# Patient Record
Sex: Female | Born: 1995 | Race: Black or African American | Hispanic: No | Marital: Single | Smoking: Never smoker
Health system: Southern US, Community
[De-identification: ages and names within clinical notes are randomized; demographics above are authoritative.]

## PROBLEM LIST (undated history)

## (undated) DIAGNOSIS — R569 Unspecified convulsions: Secondary | ICD-10-CM

## (undated) DIAGNOSIS — I10 Essential (primary) hypertension: Secondary | ICD-10-CM

## (undated) HISTORY — PX: BUTTOCK LIFT: SHX1278

## (undated) HISTORY — DX: Unspecified convulsions: R56.9

## (undated) HISTORY — DX: Essential (primary) hypertension: I10

---

## 2021-04-09 ENCOUNTER — Emergency Department (HOSPITAL_BASED_OUTPATIENT_CLINIC_OR_DEPARTMENT_OTHER): Payer: 59

## 2021-04-09 ENCOUNTER — Emergency Department (HOSPITAL_BASED_OUTPATIENT_CLINIC_OR_DEPARTMENT_OTHER)
Admission: EM | Admit: 2021-04-09 | Discharge: 2021-04-09 | Disposition: A | Discharge status: Home / Self Care | Payer: 59 | Attending: Emergency Medicine | Admitting: Emergency Medicine

## 2021-04-09 ENCOUNTER — Encounter: Payer: Self-pay | Admitting: Neurology

## 2021-04-09 ENCOUNTER — Other Ambulatory Visit: Payer: Self-pay

## 2021-04-09 ENCOUNTER — Encounter (HOSPITAL_BASED_OUTPATIENT_CLINIC_OR_DEPARTMENT_OTHER): Payer: Self-pay | Admitting: Emergency Medicine

## 2021-04-09 DIAGNOSIS — R110 Nausea: Secondary | ICD-10-CM | POA: Insufficient documentation

## 2021-04-09 DIAGNOSIS — R569 Unspecified convulsions: Secondary | ICD-10-CM | POA: Diagnosis not present

## 2021-04-09 DIAGNOSIS — R251 Tremor, unspecified: Secondary | ICD-10-CM | POA: Diagnosis not present

## 2021-04-09 LAB — CBC WITH DIFFERENTIAL/PLATELET
Abs Immature Granulocytes: 0.03 10*3/uL (ref 0.00–0.07)
Basophils Absolute: 0 10*3/uL (ref 0.0–0.1)
Basophils Relative: 1 %
Eosinophils Absolute: 0.1 10*3/uL (ref 0.0–0.5)
Eosinophils Relative: 1 %
HCT: 33.4 % — ABNORMAL LOW (ref 36.0–46.0)
Hemoglobin: 10.9 g/dL — ABNORMAL LOW (ref 12.0–15.0)
Immature Granulocytes: 1 %
Lymphocytes Relative: 43 %
Lymphs Abs: 2.1 10*3/uL (ref 0.7–4.0)
MCH: 26.1 pg (ref 26.0–34.0)
MCHC: 32.6 g/dL (ref 30.0–36.0)
MCV: 79.9 fL — ABNORMAL LOW (ref 80.0–100.0)
Monocytes Absolute: 0.3 10*3/uL (ref 0.1–1.0)
Monocytes Relative: 6 %
Neutro Abs: 2.4 10*3/uL (ref 1.7–7.7)
Neutrophils Relative %: 48 %
Platelet Morphology: NORMAL
Platelets: 253 10*3/uL (ref 150–400)
RBC: 4.18 MIL/uL (ref 3.87–5.11)
RDW: 13.2 % (ref 11.5–15.5)
WBC: 4.9 10*3/uL (ref 4.0–10.5)
nRBC: 0 % (ref 0.0–0.2)

## 2021-04-09 LAB — BASIC METABOLIC PANEL
Anion gap: 8 (ref 5–15)
BUN: 10 mg/dL (ref 6–20)
CO2: 23 mmol/L (ref 22–32)
Calcium: 8.5 mg/dL — ABNORMAL LOW (ref 8.9–10.3)
Chloride: 105 mmol/L (ref 98–111)
Creatinine, Ser: 0.59 mg/dL (ref 0.44–1.00)
GFR, Estimated: >60 mL/min (ref >60)
Glucose, Bld: 112 mg/dL — ABNORMAL HIGH (ref 70–99)
Potassium: 4.2 mmol/L (ref 3.5–5.1)
Sodium: 136 mmol/L (ref 135–145)

## 2021-04-09 LAB — RAPID URINE DRUG SCREEN, HOSP PERFORMED
Amphetamines: NOT DETECTED
Barbiturates: NOT DETECTED
Benzodiazepines: NOT DETECTED
Cocaine: NOT DETECTED
Opiates: NOT DETECTED
Tetrahydrocannabinol: POSITIVE — AB

## 2021-04-09 LAB — PREGNANCY, URINE: Preg Test, Ur: NEGATIVE

## 2021-04-09 MED ORDER — LEVETIRACETAM 500 MG/5ML IV SOLN: 2000.0000 mg | Freq: Once | INTRAVENOUS | Status: DC

## 2021-04-09 MED ORDER — LEVETIRACETAM IN NACL 1500 MG/100ML IV SOLN: 1500.0000 mg | Freq: Once | INTRAVENOUS | Status: DC

## 2021-04-09 MED ORDER — LEVETIRACETAM 500 MG PO TABS: 500.0000 mg | ORAL_TABLET | Freq: Two times a day (BID) | ORAL | 0 refills | Status: DC

## 2021-04-09 MED ORDER — LEVETIRACETAM IN NACL 1000 MG/100ML IV SOLN
1000.0000 mg | INTRAVENOUS | Status: AC
  Administered 2021-04-09 (×2): 1000 mg via INTRAVENOUS
  Filled 2021-04-09: qty 100

## 2021-04-09 NOTE — ED Notes (Signed)
Patient transported to CT 

## 2021-04-09 NOTE — ED Provider Notes (Signed)
[facility] EMERGENCY DEPT Provider Note   CSN: [id number] Arrival date & time: 04/09/21  0443     History No chief complaint on file.   Mary Nunez is a 25 y.o. female.  The history is provided by the patient.  Seizures Seizure activity on arrival: no   Seizure type:  Grand mal (and 2 previous that involved unresponsive staring) Preceding symptoms: aura and nausea   Preceding symptoms: no dizziness   Preceding symptoms comment:  On the staring episodes Initial focality:  None Episode characteristics: generalized shaking and unresponsiveness   Postictal symptoms: somnolence   Return to baseline: yes   Severity:  Moderate Duration: seconds. Number of seizures this episode:  2 Progression:  Resolved Context: not alcohol withdrawal, not intracranial shunt, medical compliance, not pregnant and not previous head injury   Recent head injury:  No recent head injuries PTA treatment:  None History of seizures: no   2 staring episodes with preceded by aura and nausea.  First in September but wasn't seen and then one tonight and then a second with generalized shaking.       History reviewed. No pertinent past medical history.  There are no problems to display for this patient.   History reviewed. No pertinent surgical history.   OB History   No obstetric history on file.     History reviewed. No pertinent family history.     Home Medications Prior to Admission medications   Medication Sig Start Date End Date Taking? Authorizing Provider  levETIRAcetam (KEPPRA) 500 MG tablet Take 1 tablet (500 mg total) by mouth 2 (two) times daily. 04/09/21  Yes Lorali Khamis, MD    Allergies    Patient has no known allergies.  Review of Systems   Review of Systems  Constitutional:  Negative for fever.  HENT:  Negative for congestion.   Eyes:  Negative for redness.  Respiratory:  Negative for wheezing and stridor.   Cardiovascular:  Negative for chest pain,  palpitations and leg swelling.  Gastrointestinal:  Negative for vomiting.  Genitourinary:  Negative for difficulty urinating.  Musculoskeletal:  Negative for neck stiffness.  Skin:  Negative for color change.  Neurological:  Positive for seizures.  Psychiatric/Behavioral:  Negative for agitation.   All other systems reviewed and are negative.  Physical Exam Updated Vital Signs BP (!) 142/93 (BP Location: Right Arm)   Pulse 85   Temp 98.1 F (36.7 C) (Oral)   Resp 18   Ht 5\' 5"  (1.651 m)   Wt 93.9 kg   LMP 03/28/2021 Comment: pt's abdomen shielded for CT of head.  SpO2 100%   BMI 34.45 kg/m   Physical Exam Vitals and nursing note reviewed.  Constitutional:      General: She is not in acute distress.    Appearance: Normal appearance.  HENT:     Head: Normocephalic and atraumatic.     Nose: Nose normal.  Eyes:     Conjunctiva/sclera: Conjunctivae normal.     Pupils: Pupils are equal, round, and reactive to light.  Cardiovascular:     Rate and Rhythm: Normal rate and regular rhythm.     Pulses: Normal pulses.     Heart sounds: Normal heart sounds.  Pulmonary:     Effort: Pulmonary effort is normal.     Breath sounds: Normal breath sounds.  Abdominal:     General: Abdomen is flat. Bowel sounds are normal.     Palpations: Abdomen is soft.     Tenderness:  There is no abdominal tenderness. There is no guarding.  Musculoskeletal:        General: Normal range of motion.     Cervical back: Normal range of motion and neck supple.  Skin:    General: Skin is warm and dry.     Capillary Refill: Capillary refill takes less than 2 seconds.  Neurological:     General: No focal deficit present.     Mental Status: She is alert and oriented to person, place, and time.     Deep Tendon Reflexes: Reflexes normal.  Psychiatric:        Mood and Affect: Mood normal.        Behavior: Behavior normal.    ED Results / Procedures / Treatments   Labs (all labs ordered are listed, but  only abnormal results are displayed) Labs Reviewed  PREGNANCY, URINE  CBC WITH DIFFERENTIAL/PLATELET  BASIC METABOLIC PANEL  RAPID URINE DRUG SCREEN, HOSP PERFORMED    EKG None  Radiology CT Head Wo Contrast  Result Date: 04/09/2021 CLINICAL DATA:  Possible seizure EXAM: CT HEAD WITHOUT CONTRAST TECHNIQUE: Contiguous axial images were obtained from the base of the skull through the vertex without intravenous contrast. COMPARISON:  None. FINDINGS: Brain: No evidence of acute infarction, hemorrhage, hydrocephalus, extra-axial collection or mass lesion/mass effect. Vascular: No hyperdense vessel or unexpected calcification. Skull: Normal. Negative for fracture or focal lesion. Sinuses/Orbits: No acute finding. Other: None. IMPRESSION: Negative head CT. Electronically Signed   By: Malachy Moan M.D.   On: 04/09/2021 05:17    Procedures Procedures   Medications Ordered in ED Medications  levETIRAcetam (KEPPRA) IVPB 1000 mg/100 mL premix (has no administration in time range)    ED Course  I have reviewed the triage vital signs and the nursing notes.  Pertinent labs & imaging results that were available during my care of the patient were reviewed by me and considered in my medical decision making (see chart for details).   530 case d/w Dr. Derry Lory, load woth 2 grams of keppra then start 500 mg BID, follow up with neuro as an outpatient  Patient informed no driving for six months or until cleared by neurology.  Patient informed verbally with nurse present and in writing on discharge paperwork.    Mary Nunez was evaluated in Emergency Department on 04/09/2021 for the symptoms described in the history of present illness. She was evaluated in the context of the global COVID-19 pandemic, which necessitated consideration that the patient might be at risk for infection with the SARS-CoV-2 virus that causes COVID-19. Institutional protocols and algorithms that pertain to the evaluation  of patients at risk for COVID-19 are in a state of rapid change based on information released by regulatory bodies including the CDC and federal and state organizations. These policies and algorithms were followed during the patient's care in the ED.  Final Clinical Impression(s) / ED Diagnoses Final diagnoses:  None   Return for intractable cough, coughing up blood, fevers > 100.4 unrelieved by medication, shortness of breath, intractable vomiting, chest pain, shortness of breath, weakness, numbness, changes in speech, facial asymmetry, abdominal pain, passing out, Inability to tolerate liquids or food, cough, altered mental status or any concerns. No signs of systemic illness or infection. The patient is nontoxic-appearing on exam and vital signs are within normal limits.  I have reviewed the triage vital signs and the nursing notes. Pertinent labs & imaging results that were available during my care of the patient were reviewed  by me and considered in my medical decision making (see chart for details). After history, exam, and medical workup I feel the patient has been appropriately medically screened and is safe for discharge home. Pertinent diagnoses were discussed with the patient. Patient was given return precautions.      Rx / DC Orders ED Discharge Orders          Ordered    levETIRAcetam (KEPPRA) 500 MG tablet  2 times daily        04/09/21 0523             Manoah Deckard, MD 04/09/21 0559

## 2021-04-09 NOTE — Discharge Instructions (Addendum)
No driving for six months or until cleared by neurology.

## 2021-04-09 NOTE — ED Notes (Signed)
Paged Neurology (Dr.S. Derry Lory) to Dr. Nicanor Alcon

## 2021-04-09 NOTE — ED Triage Notes (Signed)
Pt boyfriend reports that around 2130 last night the pt had a period where she c/o nausea and then was staring, could not get her words out for about 2 minutes. This morning around 0330 when he felt her shaking all over in the bed for about 5 minutes and she would not respond to him. Pt said she had a similar episode in September and she was told by EMS that she likely had a seizure, she is not on any medications for seizures. She c/o headache now, alert and oriented.

## 2021-04-16 ENCOUNTER — Encounter: Payer: Self-pay | Admitting: Neurology

## 2021-04-16 ENCOUNTER — Other Ambulatory Visit: Payer: Self-pay

## 2021-04-16 ENCOUNTER — Ambulatory Visit (INDEPENDENT_AMBULATORY_CARE_PROVIDER_SITE_OTHER): Payer: 59 | Admitting: Neurology

## 2021-04-16 VITALS — BP 142/91 | HR 86 | Ht 65.0 in | Wt 210.8 lb

## 2021-04-16 DIAGNOSIS — G40009 Localization-related (focal) (partial) idiopathic epilepsy and epileptic syndromes with seizures of localized onset, not intractable, without status epilepticus: Secondary | ICD-10-CM | POA: Diagnosis not present

## 2021-04-16 DIAGNOSIS — Z8249 Family history of ischemic heart disease and other diseases of the circulatory system: Secondary | ICD-10-CM

## 2021-04-16 MED ORDER — LEVETIRACETAM 500 MG PO TABS: 500.0000 mg | ORAL_TABLET | Freq: Two times a day (BID) | ORAL | 11 refills | Status: DC

## 2021-04-16 NOTE — Progress Notes (Signed)
NEUROLOGY CONSULTATION NOTE  Mary Nunez MRN: [id number] DOB: 11-24-1995  Referring provider: Dr. Cy Nunez (ER) Primary care provider: Charna Archer, FNP  Reason for consult:  seizure  Dear Dr Mary Nunez:  Thank you for your kind referral of Mary Nunez for consultation of the above symptoms. Although her history is well known to you, please allow me to reiterate it for the purpose of our medical record. The patient was accompanied to the clinic by her mother Mary Nunez who also provides collateral information. Records and images were personally reviewed where available.  HISTORY OF PRESENT ILLNESS: This is a pleasant 25 year old right-handed woman with no significant past medical history presenting for evaluation of seizures. She reports that she has had recurrent stereotyped episodes since 2017 where she would have a feeling of deja vu, out of place, overwhelming nausea for 5 minutes. She would be able to talk and comprehend during them. More recently, she also started having a gasoline smell with the symptoms. They were occurring every couple of weeks. She had her first episode of unresponsiveness in 01/2021, she was staring off and trying to speak but unable to. She was amnestic of the episodes, her sister called EMS at that time, she was not started on medication. She was symptom-free for 2 months until 04/08/21, she was asleep when she woke up from sleep and felt the same aura with nausea, then was staring off and unable to get her words out for 2 minutes. A few hours later, at 3:30am on 11/11, her boyfriend felt her shaking in bed. This lasted 5 minutes, she bit her tongue and had urinary incontinence. She woke up at home and denies any focal weakness. She was brought to the ER where bloodwork showed a Hct of 33.4, UDS positive for THC. I personally reviewed head CT without contrast, no acute changes. No prior sleep deprivation or alcohol. She endorses more stress recently. She was  discharged home on Levetiracetam 500mg  BID. She denies any auras in the past week. She has been feeling more irritable and fatigued on Levetiracetam. Prior to this, mood is "pretty normal." She has a history of depression and anxiety and does not take any medication for these.   She denies any focal numbness/tingling/weakness, no myoclonic jerks. She has been having headaches over the right hemisphere since stating the LEV. No dizziness, diplopia, dysarthria/dysphagia, neck/back pain, bowel/bladder dysfunction. Memory "could be better." She lives with her cousin. 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 Her mother has a history of cerebral aneurysm.   RF: maternal great grand uncle, maternal grand uncle started when younger, mother also has passing out; dermoid cyst in ovary, had c-section ,   Epilepsy Risk Factors:  Her maternal great grand uncle and maternal grand uncle had seizures since youth and passed away from complications of seizure. Her mother also has episodes of loss of consciousness (not on ASMs). Her mother had a dermoid cyst in her ovary during pregnancy and had a C-section. She had a normal birth and early development.  There is no history of febrile convulsions, CNS infections such as meningitis/encephalitis, significant traumatic brain injury, neurosurgical procedures   PAST MEDICAL HISTORY: Past Medical History:  Diagnosis Date   Hypertension    Seizure (HCC)     PAST SURGICAL HISTORY: Past Surgical History:  Procedure Laterality Date   BUTTOCK LIFT      MEDICATIONS: Current Outpatient Medications on File Prior to Visit  Medication Sig Dispense Refill   levETIRAcetam (KEPPRA) 500 MG tablet Take 1 tablet (500 mg total) by mouth 2 (two) times daily. 60 tablet 0   No current facility-administered medications on file prior to visit.    ALLERGIES: No Known Allergies  FAMILY HISTORY: No family history on file.  SOCIAL  HISTORY: Social History   Socioeconomic History   Marital status: Single    Spouse name: Not on file   Number of children: Not on file   Years of education: Not on file   Highest education level: Not on file  Occupational History   Not on file  Tobacco Use   Smoking status: Never   Smokeless tobacco: Never  Vaping Use   Vaping Use: Never used  Substance and Sexual Activity   Alcohol use: Never   Drug use: Never   Sexual activity: Not on file  Other Topics Concern   Not on file  Social History Narrative   Right handed    Social Determinants of Health   Financial Resource Strain: Not on file  Food Insecurity: Not on file  Transportation Needs: Not on file  Physical Activity: Not on file  Stress: Not on file  Social Connections: Not on file  Intimate Partner Violence: Not on file     PHYSICAL EXAM: Vitals:   04/16/21 0849  BP: (!) 142/91  Pulse: 86  SpO2: 97%   General: No acute distress Head:  Normocephalic/atraumatic Skin/Extremities: No rash, no edema Neurological Exam: Mental status: alert and oriented to person, place, and time, no dysarthria or aphasia, Fund of knowledge is appropriate.  Recent and remote memory are intact, 3/3 delayed recall.  Attention and concentration are normal, 5/5 WORLD backwards.  Cranial nerves: CN I: not tested CN II: pupils equal, round and reactive to light, visual fields intact CN III, IV, VI:  full range of motion, no nystagmus, no ptosis CN V: facial sensation intact CN VII: upper and lower face symmetric CN VIII: hearing intact to conversation Bulk & Tone: normal, no fasciculations. Motor: 5/5 throughout with no pronator drift. Sensation: intact to light touch, cold, pin, vibration sense.  No extinction to double simultaneous stimulation.  Romberg test negative Deep Tendon Reflexes: +1 throughout, no ankle clonus Plantar responses: downgoing bilaterally Cerebellar: no incoordination on finger to nose testing Gait:  narrow-based and steady, able to tandem walk adequately. 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 She has had auras of deja vu, epigastric sensation, olfactory hallucinations since 2017, then had a focal impaired awareness seizure in 01/2021 and most recently 04/08/21 followed by a convulsion 04/09/21. MRI brain with and without contrast and 1-hour EEG will be ordered as part of seizure workup. Her mother has a history of cerebral aneurysm, MRA head without contrast will be ordered as well. She is having some side effects on Levetiracetam 500mg  BID but we have agreed to give it a little more time, she will update me in a couple of weeks. If side effects are intolerable, we will plan to switch to Lamotrigine. Issues in women with epilepsy were discussed, she is not on birth control, start daily folic acid. [place] driving laws were discussed with the patient, and she knows to stop driving after a seizure, until 6 months seizure-free. She was advised to keep a seizure calendar, follow-up after tests. They know to call for any changes.  Thank you for allowing me to participate in the care of this patient. Please do not hesitate to call for any questions or concerns.   Patrcia Dolly, M.D.  CC: Dr. Nicanor Nunez, Mary Archer, FNP

## 2021-04-16 NOTE — Patient Instructions (Addendum)
Good to meet you.  Schedule MRI brain with and without contrast  2. Schedule 1-hour EEG  3. Continue Keppra 500mg  twice a day. Update me in a couple of weeks on how you are feeling.  4. Start a daily folic acid 1mg  tablet (you can get this over the counter)  5. Follow-up after tests, call for any changes   Seizure Precautions: 1. If medication has been prescribed for you to prevent seizures, take it exactly as directed.  Do not stop taking the medicine without talking to your doctor first, even if you have not had a seizure in a long time.   2. Avoid activities in which a seizure would cause danger to yourself or to others.  Don't operate dangerous machinery, swim alone, or climb in high or dangerous places, such as on ladders, roofs, or girders.  Do not drive unless your doctor says you may.  3. If you have any warning that you may have a seizure, lay down in a safe place where you can't hurt yourself.    4.  No driving for 6 months from last seizure, as per [facility].   Please refer to the following link on the [employer]ile[employer] of [employer]'s website for more information: http://www.epilepsyfoundation.org/answerplace/Social/driving/drivingu.cfm   5.  Maintain good sleep hygiene. Avoid alcohol.  6.  Notify your neurology if you are planning pregnancy or if you become pregnant.  7.  Contact your doctor if you have any problems that may be related to the medicine you are taking.  8.  Call 911 and bring the patient back to the ED if:        A.  The seizure lasts longer than 5 minutes.       B.  The patient doesn't awaken shortly after the seizure  C.  The patient has new problems such as difficulty seeing, speaking or moving  D.  The patient was injured during the seizure  E.  The patient has a temperature over 102 F (39C)  F.  The patient vomited and now is having trouble breathing

## 2021-04-25 ENCOUNTER — Encounter: Payer: Self-pay | Admitting: Neurology

## 2021-04-29 ENCOUNTER — Ambulatory Visit: Payer: 59 | Admitting: Neurology

## 2021-04-29 ENCOUNTER — Other Ambulatory Visit: Payer: Self-pay

## 2021-04-29 DIAGNOSIS — G40009 Localization-related (focal) (partial) idiopathic epilepsy and epileptic syndromes with seizures of localized onset, not intractable, without status epilepticus: Secondary | ICD-10-CM | POA: Diagnosis not present

## 2021-05-08 MED ORDER — LEVETIRACETAM 500 MG PO TABS: 500.0000 mg | ORAL_TABLET | Freq: Two times a day (BID) | ORAL | 3 refills | Status: DC

## 2021-05-09 ENCOUNTER — Other Ambulatory Visit: Payer: Self-pay

## 2021-05-09 ENCOUNTER — Ambulatory Visit
Admission: RE | Admit: 2021-05-09 | Discharge: 2021-05-09 | Disposition: A | Discharge status: Home / Self Care | Payer: 59 | Source: Ambulatory Visit | Attending: Neurology | Admitting: Neurology

## 2021-05-09 DIAGNOSIS — Z8249 Family history of ischemic heart disease and other diseases of the circulatory system: Secondary | ICD-10-CM

## 2021-05-09 DIAGNOSIS — G40009 Localization-related (focal) (partial) idiopathic epilepsy and epileptic syndromes with seizures of localized onset, not intractable, without status epilepticus: Secondary | ICD-10-CM

## 2021-05-09 IMAGING — MR MR HEAD WO/W CM
15 series · 48 of 48 positions shown · IV contrast (multihance)
Comparison: None.

CLINICAL DATA: Localization-related idiopathic epilepsy and
epileptic syndromes with seizures of localized onset, not
intractable, without status epilepticus.

EXAM:
MRI HEAD WITHOUT AND WITH CONTRAST
TECHNIQUE: Multiplanar, multiecho pulse sequences of the brain and surrounding
structures were obtained without and with intravenous contrast.
CONTRAST:  20mL MULTIHANCE GADOBENATE DIMEGLUMINE 529 MG/ML IV SOLN

[Series 6: T1 · sagittal · 4.0mm · 0.75mm/px · 2 of 29 slices shown (1 of 3)]
[im 1/29]
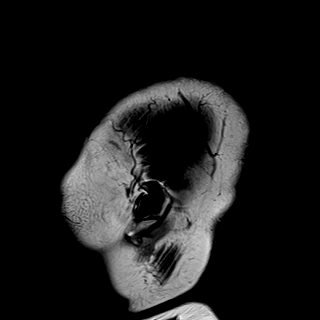
[im 29/29]
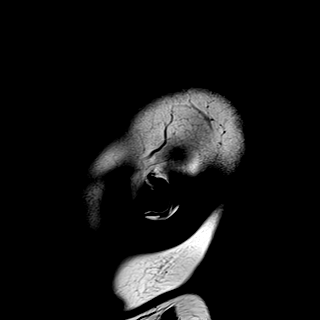

[Series 10: DWI · axial · 3.0mm · 0.94mm/px · z∈[-70,+70]mm · 8 of 160 slices shown (1 of 3)]
[im 1/160]
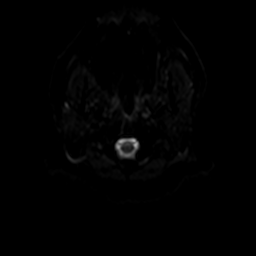
[im 23/160]
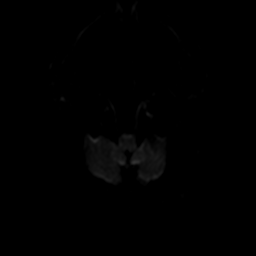
[im 46/160]
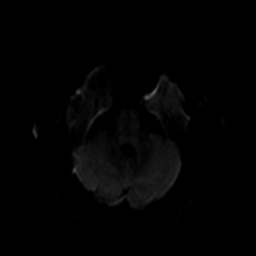
[im 69/160]
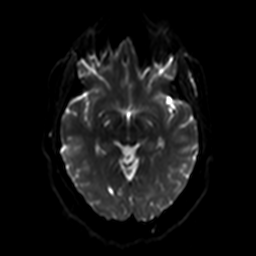
[im 91/160]
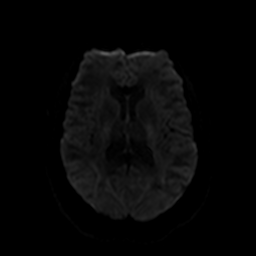
[im 114/160]
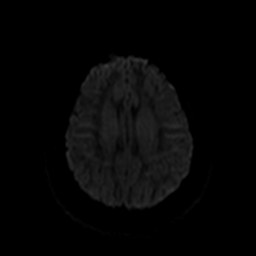
[im 137/160]
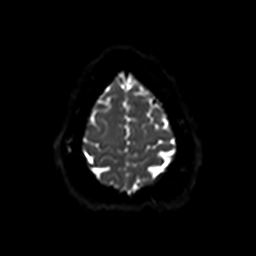
[im 160/160]
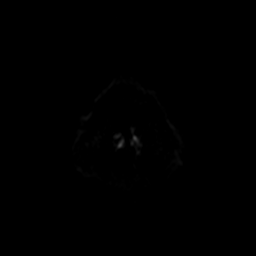

[Series 11: ax dwi_tracew · axial · 3.0mm · 0.94mm/px · z∈[-70,+70]mm · 4 of 80 slices shown]
[im 1/80]
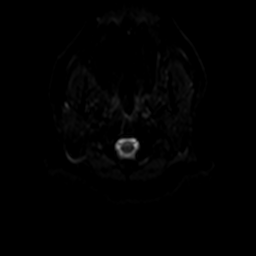
[im 27/80]
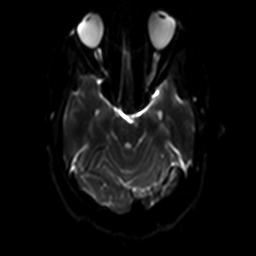
[im 53/80]
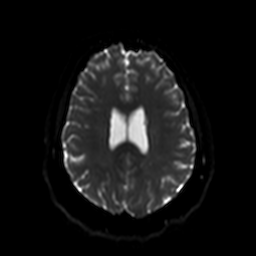
[im 80/80]
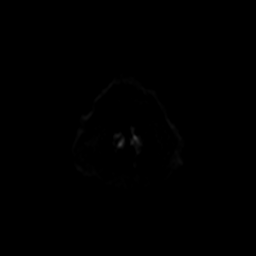

[Series 12: ax dwi_adc · axial · 3.0mm · 0.94mm/px · z∈[-70,+70]mm · 2 of 40 slices shown]
[im 1/40]
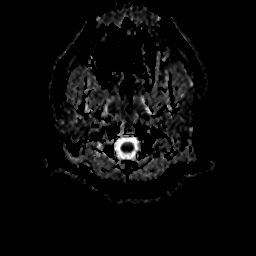
[im 40/40]
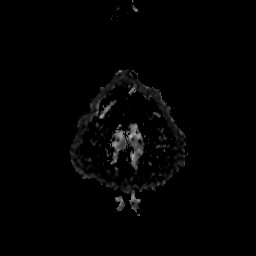

[Series 17: DWI · coronal · 5.0mm · 1.44mm/px · 3 of 60 slices shown (2 of 3)]
[im 1/60]
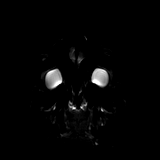
[im 30/60]
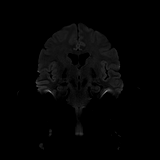
[im 60/60]
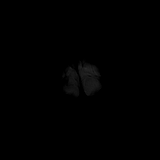

[Series 18: DWI · coronal · 5.0mm · 1.44mm/px · 2 of 30 slices shown (3 of 3)]
[im 1/30]
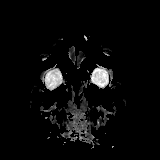
[im 30/30]
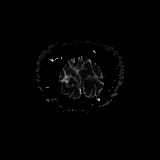

[Series 19: T2 · axial · 4.0mm · 0.36mm/px · 1 of 27 slices shown (1 of 2)]
[im 1/27]
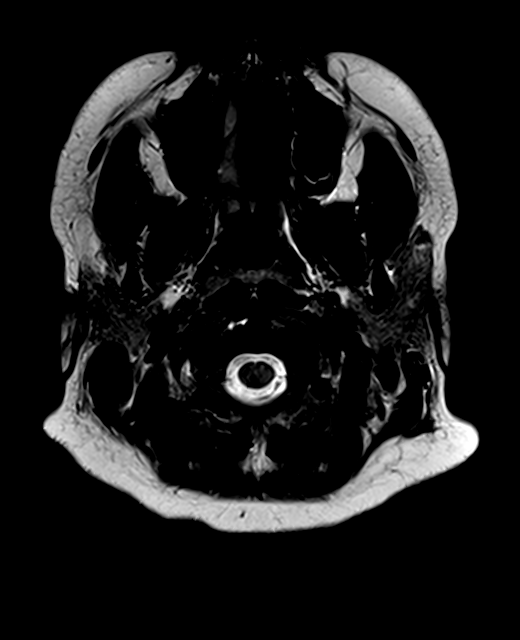

[Series 20: FLAIR · axial · 3.0mm · 0.72mm/px · 1 of 26 slices shown (1 of 2)]
[im 1/26]
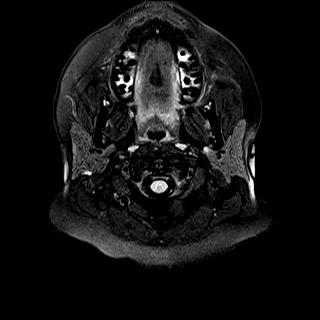

[Series 21: swi_images · axial · 2.2mm · 0.90mm/px · z∈[-68,+70]mm · 3 of 64 slices shown]
[im 1/64]
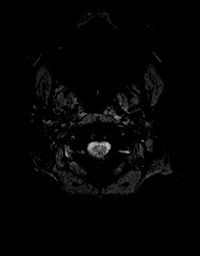
[im 32/64]
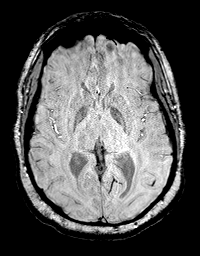
[im 64/64]
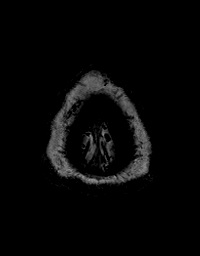

[Series 23: T1 · axial · 1.0mm · 0.94mm/px · z∈[-79,+80]mm · 8 of 160 slices shown (2 of 3)]
[im 1/160]
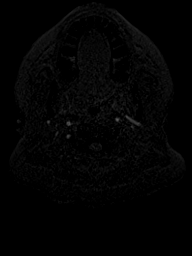
[im 23/160]
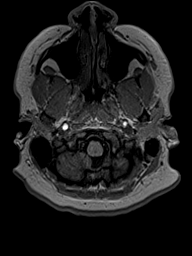
[im 46/160]
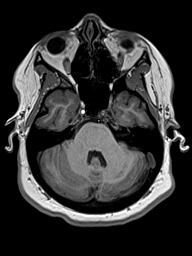
[im 69/160]
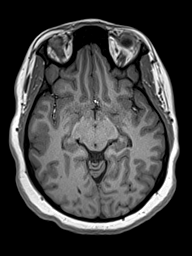
[im 91/160]
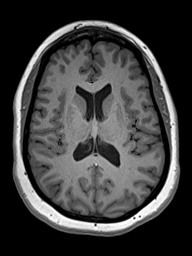
[im 114/160]
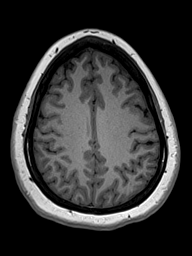
[im 137/160]
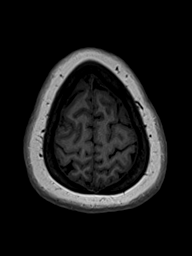
[im 160/160]
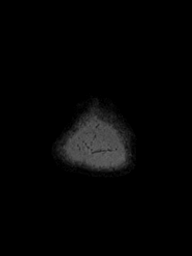

[Series 24: T2 · coronal · 3.0mm · 0.47mm/px · 1 of 25 slices shown (2 of 2)]
[im 1/25]
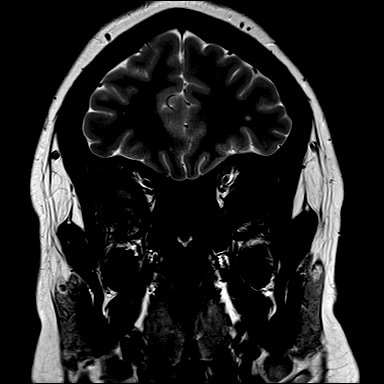

[Series 25: FLAIR · coronal · 3.0mm · 0.56mm/px · 1 of 25 slices shown (2 of 2)]
[im 1/25]
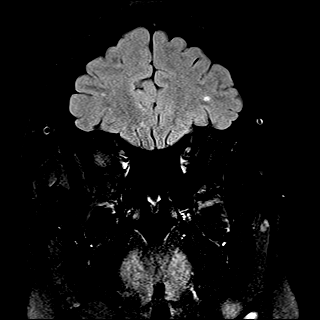

[Series 26: T2 post-contrast · coronal · 4.0mm · 0.36mm/px · 2 of 35 slices shown]
[im 1/35]
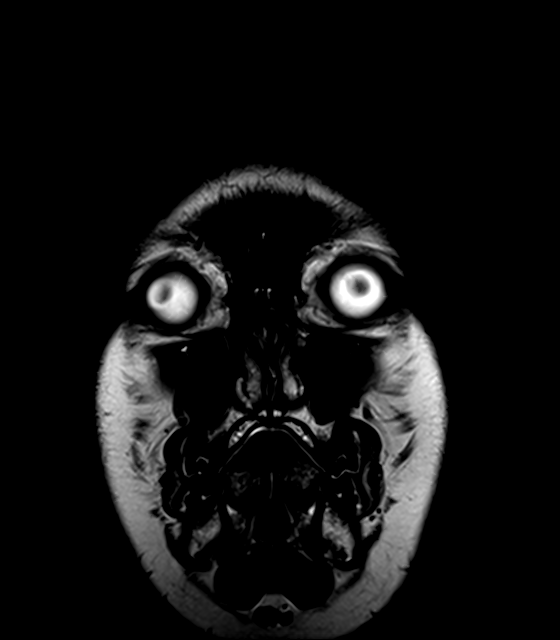
[im 35/35]
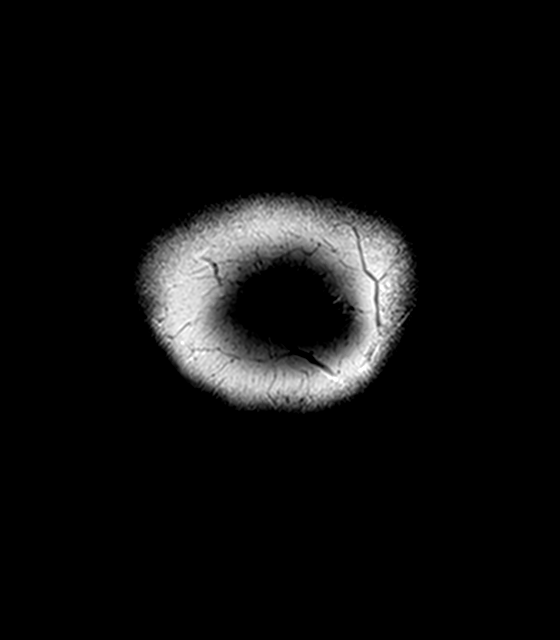

[Series 27: T1 · axial · 1.0mm · 0.94mm/px · z∈[-79,+80]mm · 8 of 160 slices shown (3 of 3)]
[im 1/160]
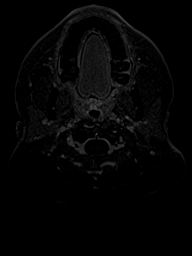
[im 23/160]
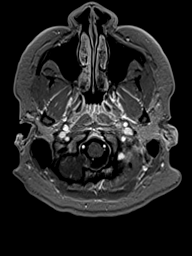
[im 46/160]
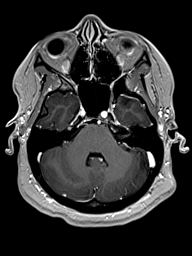
[im 69/160]
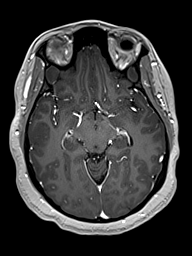
[im 91/160]
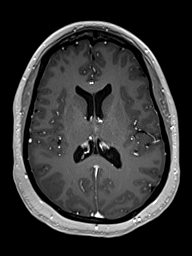
[im 114/160]
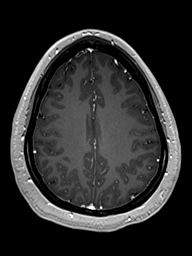
[im 137/160]
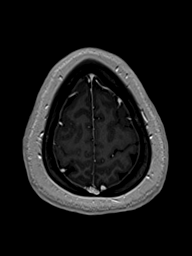
[im 160/160]
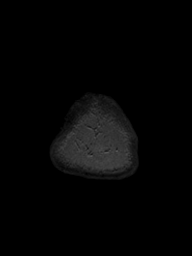

[Series 28: T1 post-contrast · coronal · 4.0mm · 0.72mm/px · 2 of 35 slices shown]
[im 1/35]
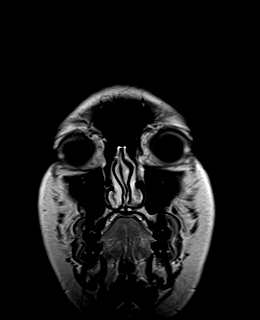
[im 35/35]
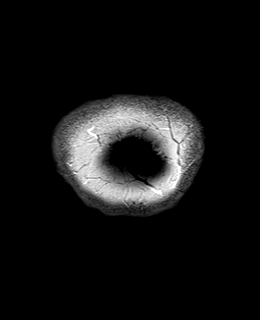

[48 of 48 positions shown; findings below may reference images not displayed]

FINDINGS: Brain: No acute infarction, hemorrhage, hydrocephalus, extra-axial
collection or mass lesion. Small amount of scattered foci of T2
hyperintensity are seen within the white matter of the cerebral
hemispheres nonspecific. Mesial temporal lobes are symmetric, with
normal morphology and characteristics. No focus of abnormal contrast
enhancement.

Vascular: Normal flow voids.

Skull and upper cervical spine: Normal marrow signal.

Sinuses/Orbits: Negative.

Other: None.
IMPRESSION: Small amount of nonspecific T2 hyperintense lesions of the white
matter. Differential diagnosis includes migraine headache,
demyelinating disease, vasculitis and sequela of prior
inflammatory/infectious process.

## 2021-05-09 IMAGING — MR MR MRA HEAD W/O CM
1 series · 10 of 48 positions shown · non-contrast
Comparison: No pertinent prior exam.

CLINICAL DATA: Initial evaluation for family history of aneurysm.

EXAM:
MRA HEAD WITHOUT CONTRAST
TECHNIQUE: Angiographic images of the Circle of Willis were acquired using MRA
technique without intravenous contrast.

[Series 8: TOF · axial · 0.5mm · 0.26mm/px · z∈[-30,+32]mm · 10 of 168 slices shown]
[im 11/168]
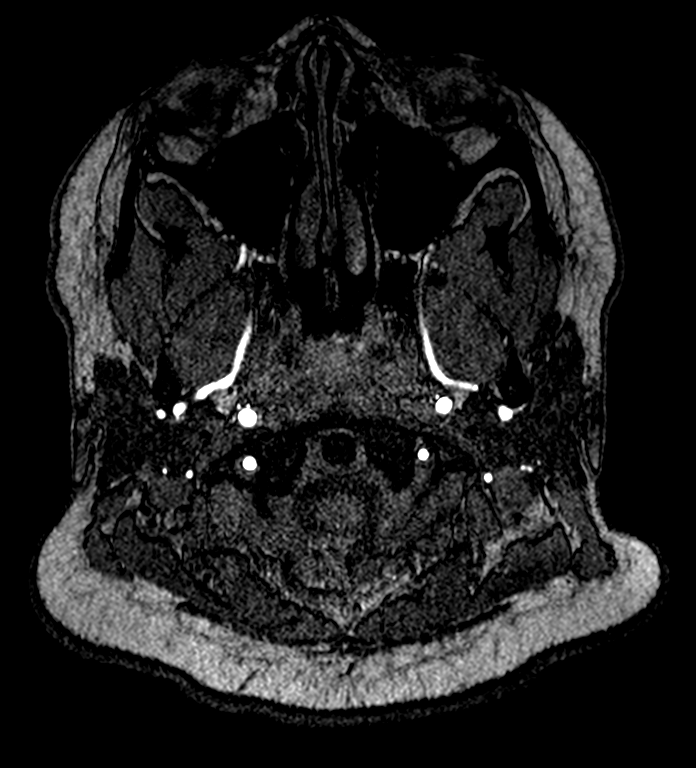
[im 29/168]
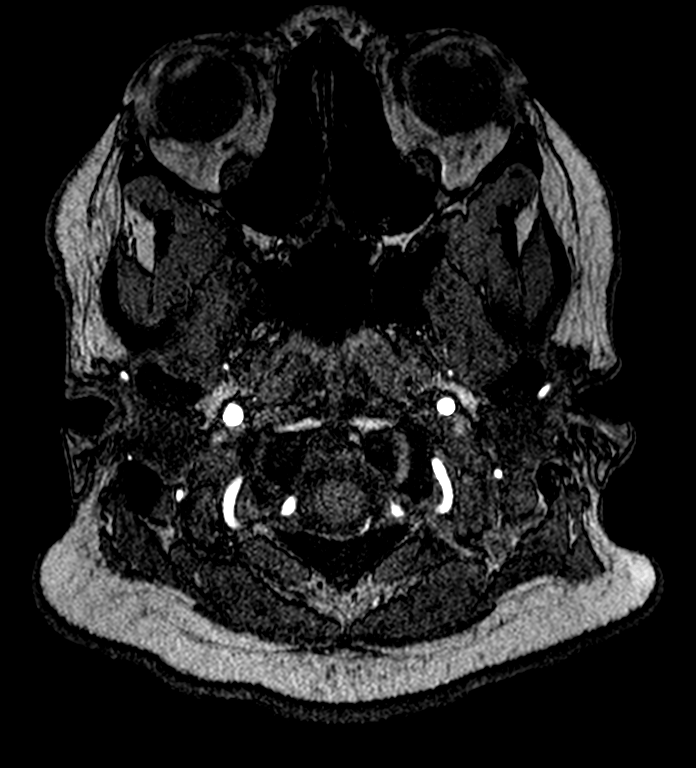
[im 32/168]
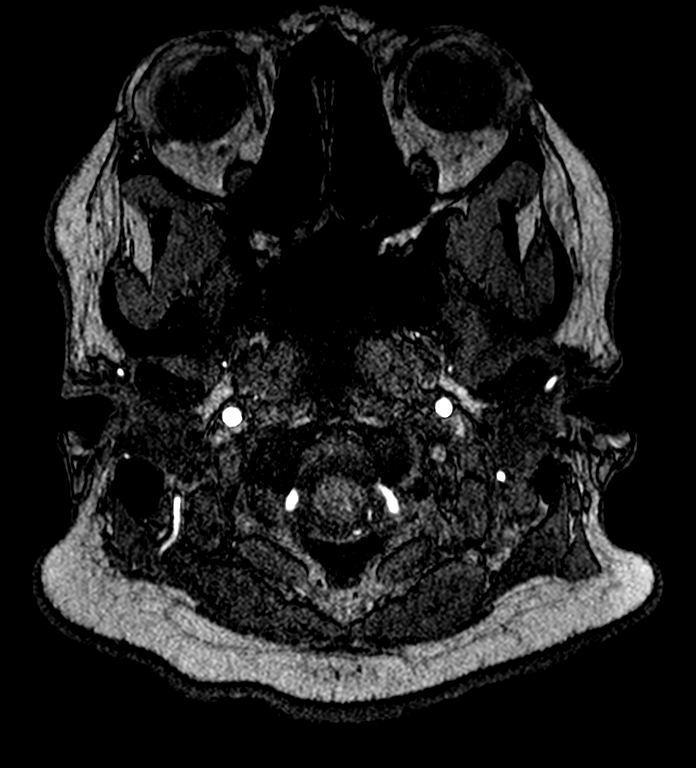
[im 54/168]
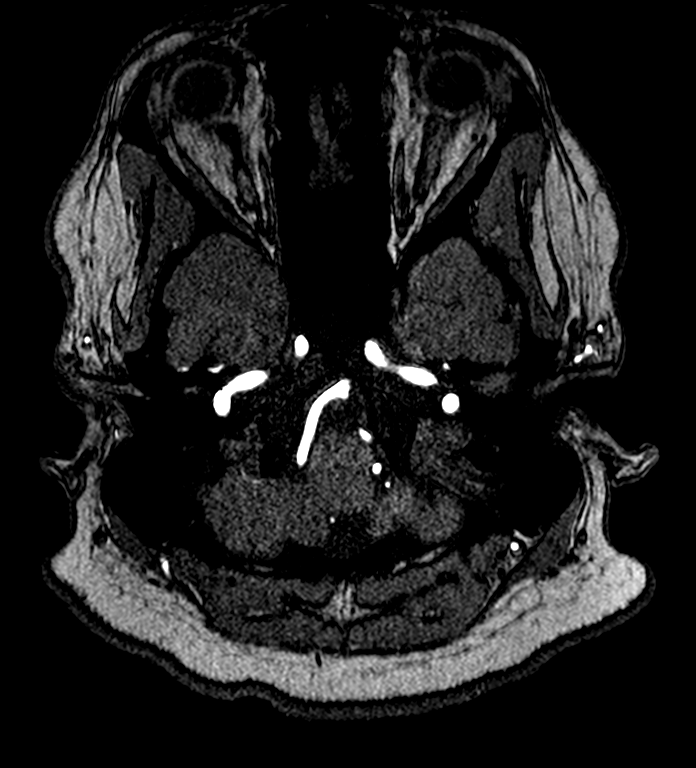
[im 75/168]
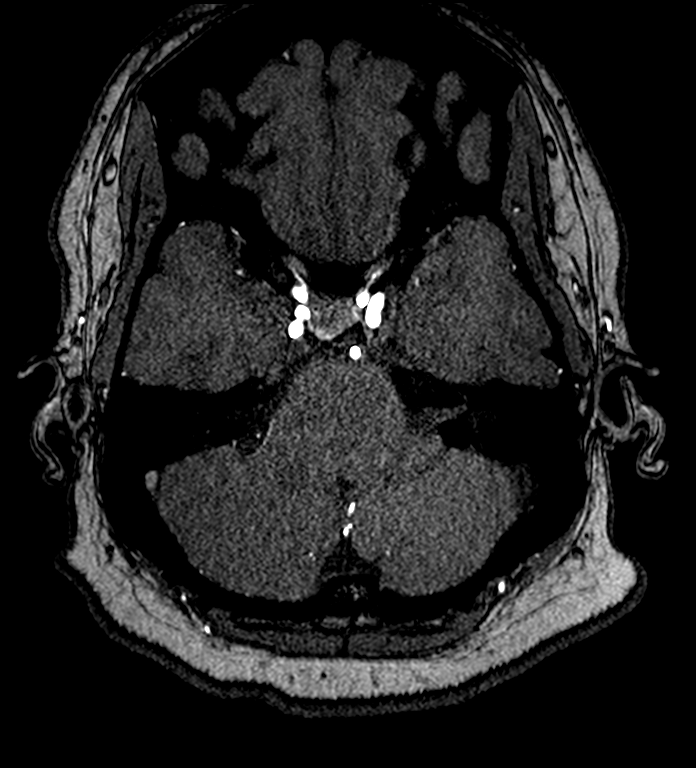
[im 86/168]
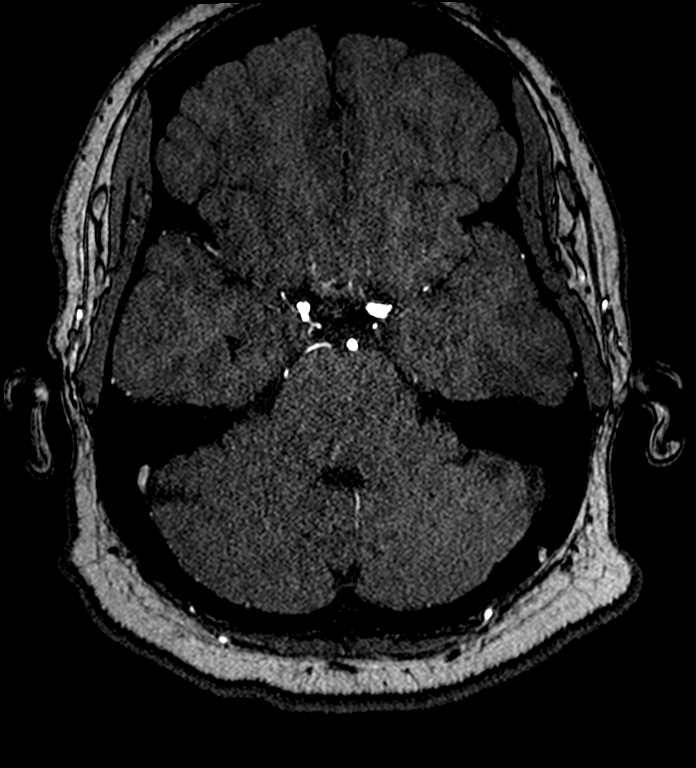
[im 96/168]
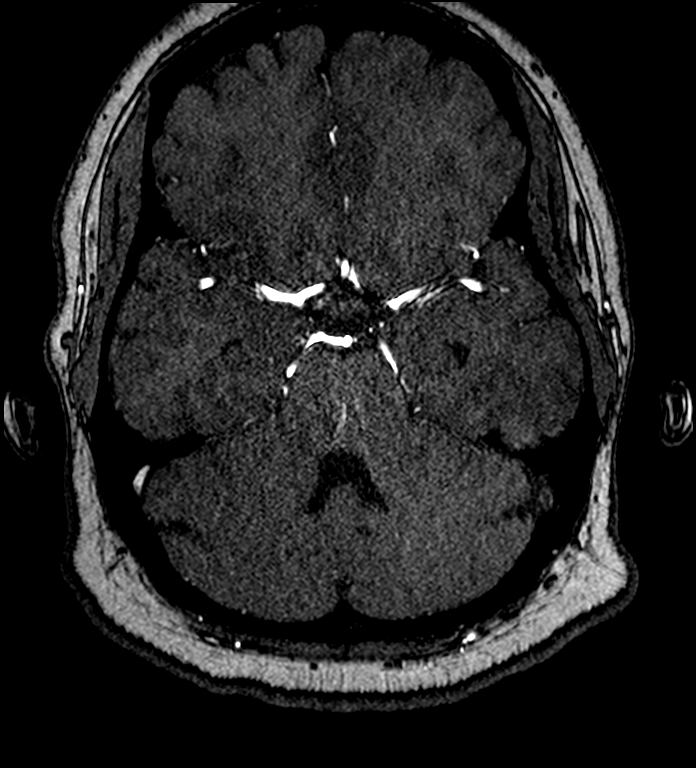
[im 118/168]
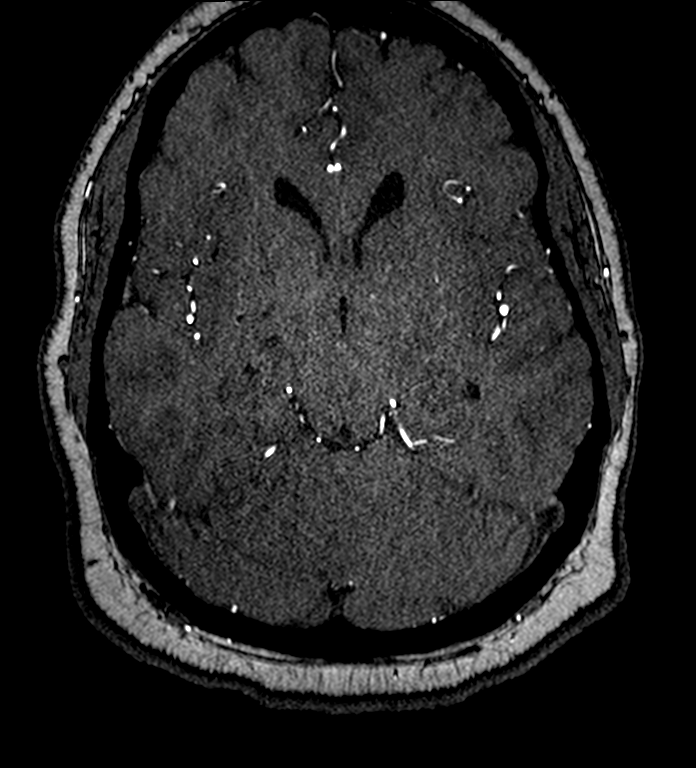
[im 139/168]
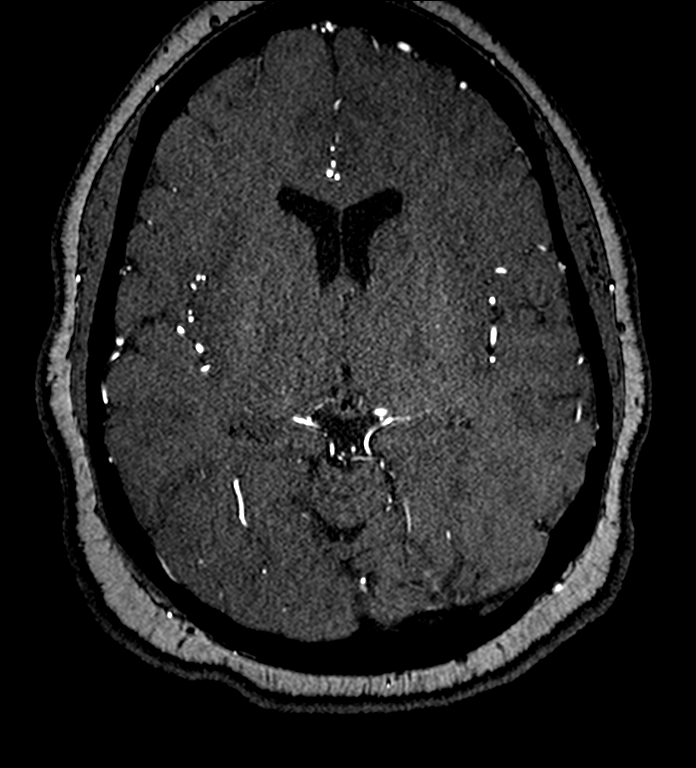
[im 143/168]
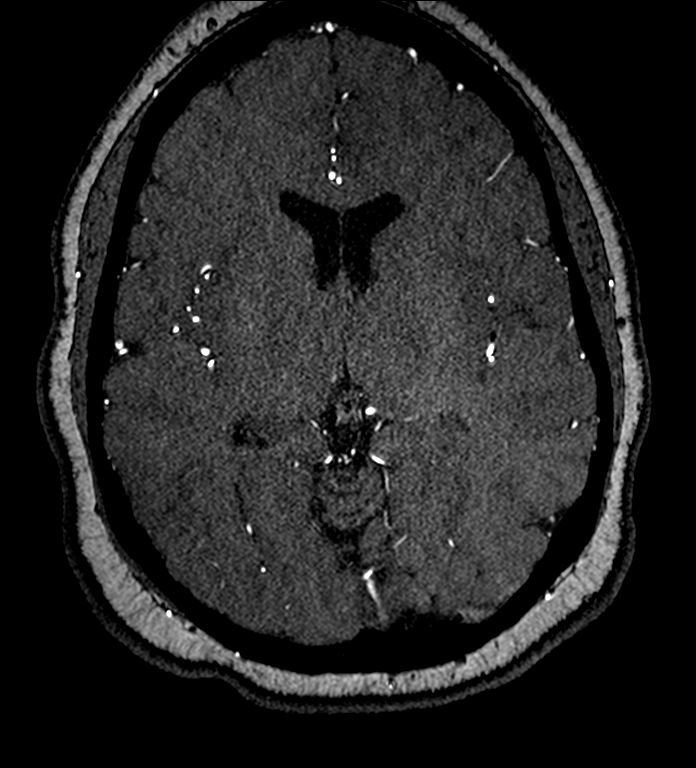

[10 of 48 positions shown; findings below may reference images not displayed]

FINDINGS: Anterior circulation: Multifocal irregularity seen about the
visualized distal cervical segments of both internal carotid
arteries, more pronounced on the right (series 8, image 20). Changes
on the left are fairly subtle in nature. Findings are indeterminate,
but could reflect sequelae of FMD. Petrous, cavernous, and
supraclinoid segments patent without stenosis or other abnormality.
Origins of the ophthalmic arteries patent and normal. A1 segments
widely patent. Normal anterior communicating artery complex.
Anterior cerebral arteries widely patent to their distal aspects. No
M1 stenosis or occlusion. Normal MCA bifurcations. Distal MCA
branches well perfused and symmetric.

Posterior circulation: Both vertebral arteries patent to the
vertebrobasilar junction without stenosis. Right vertebral artery
dominant. Left PICA origin patent and normal. Right PICA not seen.
Dominant right AICA. Basilar patent to its distal aspect without
stenosis. Superior cerebellar arteries patent bilaterally. Both PCA
supplied via the basilar as well as small bilateral posterior
communicating arteries. Both PCAs well perfused or distal aspects
without stenosis.

Anatomic variants: None significant.

Other: No intracranial aneurysm or other vascular abnormality.
IMPRESSION: 1. Multifocal irregularity about the visualized distal cervical
segments of both internal carotid arteries, right greater than left.
Findings are indeterminate, but could reflect sequelae of FMD.
Correlation with dedicated MRA of the neck could be performed for
further evaluation as warranted.
2. Otherwise normal intracranial MRA. No intracranial aneurysm or
other vascular abnormality.

## 2021-05-09 MED ORDER — GADOBENATE DIMEGLUMINE 529 MG/ML IV SOLN
20.0000 mL | Freq: Once | INTRAVENOUS | Status: AC | PRN
  Administered 2021-05-09: 20 mL via INTRAVENOUS

## 2021-05-10 ENCOUNTER — Telehealth: Payer: Self-pay | Admitting: Neurology

## 2021-05-10 ENCOUNTER — Other Ambulatory Visit: Payer: Self-pay

## 2021-05-10 DIAGNOSIS — I6523 Occlusion and stenosis of bilateral carotid arteries: Secondary | ICD-10-CM

## 2021-05-10 NOTE — Telephone Encounter (Signed)
Order for MRA neck placed in Epic

## 2021-05-10 NOTE — Telephone Encounter (Signed)
Spoke to patient. She eventually got her period and this was her main concern with the Keppra. Discussed how keppra does not typically affect menses. She reports mood is okay, just a lot going on. Would like to continue on Keppra 500mg  BID since she is more settled rather than switch for now. She denies any seizures. Discussed normal EEG, unremarkable brain MRI. Discussed MRA head no aneurysm, however there was note of multifocal irregularity about the visualized distal cervical segments of both internal carotid arteries, right greater than left. 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 Correlation with dedicated MRA of the neck could be performed for further evaluation as warranted.  Heather, pls order MRA neck with and without contrast, dx: carotid stenosis bilateral.  Thanks!

## 2021-05-11 NOTE — Procedures (Signed)
ELECTROENCEPHALOGRAM REPORT  Date of Study: 04/29/2021  Patient's Name: Mary Nunez MRN: [id number] Date of Birth: [birth year]  Referring Provider: Dr. Patrcia Dolly  Clinical History: This is a 25 year old woman with recurrent episodes of deja vu, olfactory hallucinations, episode of unresponsiveness followed by convulsion in 03/2021. EEG for classification.  Medications: Keppra Folic acid  Technical Summary: A multichannel digital 1-hour EEG recording measured by the international 10-20 system with electrodes applied with paste and impedances below 5000 ohms performed in our laboratory with EKG monitoring in an awake and asleep patient.  Hyperventilation was not performed. Photic stimulation was performed.  The digital EEG was referentially recorded, reformatted, and digitally filtered in a variety of bipolar and referential montages for optimal display.    Description: The patient is awake and asleep during the recording.  During maximal wakefulness, there is a symmetric, medium voltage 10 Hz posterior dominant rhythm that attenuates with eye opening.  The record is symmetric.  During drowsiness and stage I sleep, there is an increase in theta slowing of the background with rare vertex waves seen.  Photic stimulation did not elicit any abnormalities.  There were no epileptiform discharges or electrographic seizures seen.    EKG lead was unremarkable.  Impression: This 1-hour awake and asleep EEG is normal.    Clinical Correlation: A normal EEG does not exclude a clinical diagnosis of epilepsy.  If further clinical questions remain, prolonged EEG may be helpful.  Clinical correlation is advised.   Patrcia Dolly, M.D.

## 2021-06-05 ENCOUNTER — Other Ambulatory Visit: Payer: 59

## 2021-08-03 ENCOUNTER — Encounter: Payer: Self-pay | Admitting: Neurology

## 2021-08-16 ENCOUNTER — Other Ambulatory Visit: Payer: Self-pay | Admitting: Neurology

## 2021-12-05 ENCOUNTER — Other Ambulatory Visit: Payer: Self-pay | Admitting: Neurology

## 2021-12-21 ENCOUNTER — Other Ambulatory Visit: Payer: Self-pay | Admitting: Neurology

## 2022-01-18 ENCOUNTER — Other Ambulatory Visit: Payer: Self-pay | Admitting: Neurology

## 2022-01-18 NOTE — Telephone Encounter (Signed)
Pls check with patient if she is still taking it. If yes, she needs to come for a f/u appt. I think she lives farther away, if so, she will need to establish care with new neurologist moving forward also.

## 2022-01-18 NOTE — Telephone Encounter (Signed)
Pt called to see if she is still talken her medication voice mail left to call the office

## 2022-01-21 ENCOUNTER — Other Ambulatory Visit: Payer: Self-pay

## 2022-01-21 MED ORDER — LEVETIRACETAM 500 MG PO TABS: 500.0000 mg | ORAL_TABLET | Freq: Two times a day (BID) | ORAL | 0 refills | Status: DC

## 2022-02-07 ENCOUNTER — Other Ambulatory Visit: Payer: Self-pay

## 2022-02-07 ENCOUNTER — Other Ambulatory Visit: Payer: Self-pay | Admitting: Neurology

## 2022-02-07 MED ORDER — LEVETIRACETAM 500 MG PO TABS: 500.0000 mg | ORAL_TABLET | Freq: Two times a day (BID) | ORAL | 1 refills | Status: DC

## 2022-02-07 MED ORDER — LEVETIRACETAM 500 MG PO TABS: 500.0000 mg | ORAL_TABLET | Freq: Two times a day (BID) | ORAL | 0 refills | Status: DC

## 2022-04-12 ENCOUNTER — Encounter: Payer: Self-pay | Admitting: Neurology

## 2022-04-12 ENCOUNTER — Telehealth (INDEPENDENT_AMBULATORY_CARE_PROVIDER_SITE_OTHER): Payer: Self-pay | Admitting: Neurology

## 2022-04-12 VITALS — Ht 64.0 in | Wt 209.0 lb

## 2022-04-12 DIAGNOSIS — G40009 Localization-related (focal) (partial) idiopathic epilepsy and epileptic syndromes with seizures of localized onset, not intractable, without status epilepticus: Secondary | ICD-10-CM

## 2022-04-12 MED ORDER — LEVETIRACETAM 500 MG PO TABS: ORAL_TABLET | ORAL | 3 refills | Status: DC

## 2022-04-12 NOTE — Patient Instructions (Signed)
Good to see you. Continue Levetiracetam 500mg  every night. If seizures recur with no triggers (such as missing medication), we will need to switch to a different seizure medication. Call for any changes. Follow-up in 6 months.   Seizure Precautions: 1. If medication has been prescribed for you to prevent seizures, take it exactly as directed.  Do not stop taking the medicine without talking to your doctor first, even if you have not had a seizure in a long time.   2. Avoid activities in which a seizure would cause danger to yourself or to others.  Don't operate dangerous machinery, swim alone, or climb in high or dangerous places, such as on ladders, roofs, or girders.  Do not drive unless your doctor says you may.  3. If you have any warning that you may have a seizure, lay down in a safe place where you can't hurt yourself.    4.  No driving for 6 months from last seizure, as per [facility].   Please refer to the following link on the [employer]ile[employer] of [employer]'s website for more information: http://www.epilepsyfoundation.org/answerplace/Social/driving/drivingu.cfm   5.  Maintain good sleep hygiene. Avoid alcohol.  6.  Notify your neurology if you are planning pregnancy or if you become pregnant.  7.  Contact your doctor if you have any problems that may be related to the medicine you are taking.  8.  Call 911 and bring the patient back to the ED if:        A.  The seizure lasts longer than 5 minutes.       B.  The patient doesn't awaken shortly after the seizure  C.  The patient has new problems such as difficulty seeing, speaking or moving  D.  The patient was injured during the seizure  E.  The patient has a temperature over 102 F (39C)  F.  The patient vomited and now is having trouble breathing

## 2022-04-12 NOTE — Progress Notes (Signed)
Virtual Visit via Video Note The purpose of this virtual visit is to provide medical care while limiting exposure to the novel coronavirus.    Consent was obtained for video visit:  Yes.   Answered questions that patient had about telehealth interaction:  Yes.   I discussed the limitations, risks, security and privacy concerns of performing an evaluation and management service by telemedicine. I also discussed with the patient that there may be a patient responsible charge related to this service. The patient expressed understanding and agreed to proceed.  Pt location: Home Physician Location: office Name of referring provider:  Cyril Mourning, FNP I connected with Mary Nunez at patients initiation/request on 04/12/2022 at  4:00 PM EST by video enabled telemedicine application and verified that I am speaking with the correct person using two identifiers. Pt MRN:  [id number] Pt DOB:  1996-03-25 Video Participants:  Mary Nunez   History of Present Illness:  The patient had a virtual video visit on 04/12/2022. She was last seen a year ago for seizures suggestive of temporal lobe epilepsy. Records and images were reviewed. I personally reviewed MRI brain with and without contrast done 04/2021 which did not show any acute changes. There were a few white matter hyperintensities seen bilaterally. Hippocampi symmetric with no abnormal signal or enhancement seen. Her mother had a cerebral aneurysm, her MRA did not show any evidence of aneurysm. Her 1-hour EEG in 04/2021 was normal. She reports that Levetiracetam 500mg  BID was causing a drastic change in her emotions, so she self-reduced to 500mg  qhs in March 2023. She reports a seizure on 12/27/21, she was asleep then woke up at 4am feeling something. She called her sister and was saying "why is this happening to me," but she does not remember this. She then vomited and came to, lay on the floor, and thinks that is when she had a convulsion.  Her family arrived and she does not remember letting them in the house. She bit her tongue badly and felt sore, no focal weakness. She had ran out of medication and was out for at least 2 days. No alcohol or sleep deprivation. She denies any episodes of deja vu or gasoline smell in the past year. She denies any staring/unresponsive episodes, focal numbness/tingling/weakness, myoclonic jerks. No headaches, dizziness, vision changes, no falls. She usually gets 10 hours of sleep but lately wakes up because she feels overheated. No pregnancy plans.    History on Initial Assessment 04/16/2021: This is a pleasant 26 year old right-handed woman with no significant past medical history presenting for evaluation of seizures. She reports that she has had recurrent stereotyped episodes since 2017 where she would have a feeling of deja vu, out of place, overwhelming nausea for 5 minutes. She would be able to talk and comprehend during them. More recently, she also started having a gasoline smell with the symptoms. They were occurring every couple of weeks. She had her first episode of unresponsiveness in 01/2021, she was staring off and trying to speak but unable to. She was amnestic of the episodes, her sister called EMS at that time, she was not started on medication. She was symptom-free for 2 months until 04/08/21, she was asleep when she woke up from sleep and felt the same aura with nausea, then was staring off and unable to get her words out for 2 minutes. A few hours later, at 3:30am on 11/11, her boyfriend felt her shaking in bed. This lasted 5 minutes,  she bit her tongue and had urinary incontinence. She woke up at home and denies any focal weakness. She was brought to the ER where bloodwork showed a Hct of 33.4, UDS positive for THC. I personally reviewed head CT without contrast, no acute changes. No prior sleep deprivation or alcohol. She endorses more stress recently. She was discharged home on Levetiracetam  500mg  BID. She denies any auras in the past week. She has been feeling more irritable and fatigued on Levetiracetam. Prior to this, mood is "pretty normal." She has a history of depression and anxiety and does not take any medication for these.   She denies any focal numbness/tingling/weakness, no myoclonic jerks. She has been having headaches over the right hemisphere since stating the LEV. No dizziness, diplopia, dysarthria/dysphagia, neck/back pain, bowel/bladder dysfunction. Memory "could be better." She lives with her cousin. 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 Her mother has a history of cerebral aneurysm.   Epilepsy Risk Factors:  Her maternal great grand uncle and maternal grand uncle had seizures since youth and passed away from complications of seizure. Her mother also has episodes of loss of consciousness (not on ASMs). Her mother had a dermoid cyst in her ovary during pregnancy and had a C-section. She had a normal birth and early development.  There is no history of febrile convulsions, CNS infections such as meningitis/encephalitis, significant traumatic brain injury, neurosurgical procedures    Current Outpatient Medications on File Prior to Visit  Medication Sig Dispense Refill   levETIRAcetam (KEPPRA) 500 MG tablet Take 1 tablet (500 mg total) by mouth 2 (two) times daily. 60 tablet 1   No current facility-administered medications on file prior to visit.     Observations/Objective:   Vitals:   04/12/22 1424  Weight: 209 lb (94.8 kg)  Height: 5\' 4"  (1.626 m)   GEN:  The patient appears stated age and is in NAD.  Neurological examination: Patient is awake, alert. No aphasia or dysarthria. Intact fluency and comprehension.Cranial nerves: Extraocular movements intact. No facial asymmetry. Motor: moves all extremities symmetrically, at least anti-gravity x 4.    Assessment and Plan:   This is a pleasant 26 yo RH woman with seizures  suggestive of temporal lobe epilepsy. MRI brain and EEG normal. She has had auras of deja vu, epigastric sensation, olfactory hallucinations since 2017, then had a focal impaired awareness seizures with convulsion in 03/2021 and most recently 12/27/21 in the setting of missed Levetiracetam doses. She had otherwise been doing well seizure-free for 8 months on low dose Levetiracetam 500mg  qhs. We discussed that this is a low dose, however since she had been doing well aside from seizure due to missed doses, we can stay on low dose for now. If seizures recur with no triggers, would switch to a different medication. 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 She is aware of [place] driving laws to stop driving until 6 months seizure-free. Follow-up in 6 months, call for any changes.     Follow Up Instructions:   -I discussed the assessment and treatment plan with the patient. The patient was provided an opportunity to ask questions and all were answered. The patient agreed with the plan and demonstrated an understanding of the instructions.   The patient was advised to call back or seek an in-person evaluation if the symptoms worsen or if the condition fails to improve as anticipated.     04/2021, MD

## 2022-11-01 ENCOUNTER — Ambulatory Visit: Payer: Self-pay | Admitting: Neurology

## 2023-05-08 ENCOUNTER — Telehealth: Payer: Self-pay | Admitting: Neurology

## 2023-05-08 ENCOUNTER — Other Ambulatory Visit: Payer: Self-pay

## 2023-05-08 DIAGNOSIS — G40009 Localization-related (focal) (partial) idiopathic epilepsy and epileptic syndromes with seizures of localized onset, not intractable, without status epilepticus: Secondary | ICD-10-CM

## 2023-05-08 MED ORDER — LEVETIRACETAM 500 MG PO TABS: ORAL_TABLET | ORAL | 0 refills | Status: DC

## 2023-05-08 NOTE — Addendum Note (Signed)
Addended by: Dimas Chyle on: 05/08/2023 01:50 PM   Modules accepted: Orders

## 2023-05-08 NOTE — Telephone Encounter (Signed)
Pt called informed RX prescription has been called in. Lab order faxed to [phone] [facility] [address]

## 2023-05-08 NOTE — Telephone Encounter (Signed)
She will have to use [facility] for now and pay out of pocket, I think. She will also need to have Keppra level checked this week, then every month of her pregnancy. Any seizures? Thanks

## 2023-05-08 NOTE — Telephone Encounter (Signed)
Patient called to get a refill on her keppra 500mg  once daily. She is currently [redacted] weeks pregnant. She just moved back to [place]. She was living in [place]. She only has 3 pills left. Her insurance is not valid for [place] until Jan 2025. [place] now.   [facility] [address] [place] [address]  She wants a call back today to discuss this and to make sure she gets a refill. She does have an appt sch 05/17/23

## 2023-05-17 ENCOUNTER — Encounter: Payer: Self-pay | Admitting: Neurology

## 2023-05-17 ENCOUNTER — Ambulatory Visit: Payer: Self-pay | Admitting: Neurology

## 2023-05-17 DIAGNOSIS — Z029 Encounter for administrative examinations, unspecified: Secondary | ICD-10-CM

## 2023-06-02 ENCOUNTER — Other Ambulatory Visit: Payer: Self-pay | Admitting: Neurology

## 2023-06-09 ENCOUNTER — Other Ambulatory Visit: Payer: Self-pay

## 2023-06-09 ENCOUNTER — Telehealth: Payer: Self-pay | Admitting: Neurology

## 2023-06-09 MED ORDER — LEVETIRACETAM 500 MG PO TABS: ORAL_TABLET | ORAL | 0 refills | Status: AC

## 2023-06-09 NOTE — Telephone Encounter (Signed)
 Pt called she moved to [facility] and left her medication here in [place] she is asking if we can send her [facility] to the [facility] in [place].  Pt also needed her record transferred to her new neurologist in [place] she was given the number to medical records

## 2023-06-09 NOTE — Telephone Encounter (Signed)
 Caller stated she needs to speak with Aquino's nurse concerning some complications she is having

## 2023-06-09 NOTE — Telephone Encounter (Signed)
 See other phone note

## 2023-07-01 ENCOUNTER — Other Ambulatory Visit: Payer: Self-pay | Admitting: Neurology
